# Patient Record
Sex: Male | Born: 1955 | Race: White | Hispanic: No | Marital: Single | Smoking: Never smoker
Health system: Southern US, Community
[De-identification: ages and names within clinical notes are randomized; demographics above are authoritative.]

## PROBLEM LIST (undated history)

## (undated) DIAGNOSIS — M545 Low back pain, unspecified: Secondary | ICD-10-CM

## (undated) DIAGNOSIS — N529 Male erectile dysfunction, unspecified: Secondary | ICD-10-CM

## (undated) DIAGNOSIS — R7989 Other specified abnormal findings of blood chemistry: Secondary | ICD-10-CM

## (undated) DIAGNOSIS — Z86010 Personal history of colonic polyps: Secondary | ICD-10-CM

## (undated) DIAGNOSIS — R972 Elevated prostate specific antigen [PSA]: Secondary | ICD-10-CM

## (undated) DIAGNOSIS — M62838 Other muscle spasm: Secondary | ICD-10-CM

## (undated) DIAGNOSIS — N419 Inflammatory disease of prostate, unspecified: Secondary | ICD-10-CM

## (undated) DIAGNOSIS — E291 Testicular hypofunction: Secondary | ICD-10-CM

## (undated) DIAGNOSIS — F419 Anxiety disorder, unspecified: Secondary | ICD-10-CM

## (undated) DIAGNOSIS — M542 Cervicalgia: Secondary | ICD-10-CM

## (undated) HISTORY — DX: Elevated prostate specific antigen (PSA): R97.20

## (undated) HISTORY — DX: Anxiety disorder, unspecified: F41.9

## (undated) HISTORY — DX: Low back pain: M54.5

## (undated) HISTORY — DX: Low back pain, unspecified: M54.50

## (undated) HISTORY — DX: Personal history of colonic polyps: Z86.010

## (undated) HISTORY — PX: TYMPANOSTOMY TUBE PLACEMENT: SHX32

## (undated) HISTORY — DX: Testicular hypofunction: E29.1

## (undated) HISTORY — DX: Cervicalgia: M54.2

## (undated) HISTORY — DX: Male erectile dysfunction, unspecified: N52.9

## (undated) HISTORY — DX: Inflammatory disease of prostate, unspecified: N41.9

## (undated) HISTORY — DX: Other specified abnormal findings of blood chemistry: R79.89

## (undated) HISTORY — DX: Other muscle spasm: M62.838

---

## 2003-09-16 HISTORY — PX: ROTATOR CUFF REPAIR: SHX139

## 2005-09-15 HISTORY — PX: UPPER GI ENDOSCOPY: SHX6162

## 2005-10-03 ENCOUNTER — Ambulatory Visit: Payer: Self-pay | Admitting: General Surgery

## 2010-09-15 HISTORY — PX: COLONOSCOPY: SHX174

## 2010-11-12 ENCOUNTER — Ambulatory Visit: Payer: Self-pay | Admitting: General Surgery

## 2010-11-14 LAB — PATHOLOGY REPORT

## 2010-11-15 ENCOUNTER — Ambulatory Visit: Payer: Self-pay | Admitting: General Surgery

## 2011-06-11 ENCOUNTER — Ambulatory Visit: Payer: Self-pay

## 2011-09-01 ENCOUNTER — Ambulatory Visit: Payer: Self-pay | Admitting: Unknown Physician Specialty

## 2013-03-31 ENCOUNTER — Encounter: Payer: Self-pay | Admitting: *Deleted

## 2014-10-26 ENCOUNTER — Ambulatory Visit: Payer: Self-pay | Admitting: Unknown Physician Specialty

## 2015-05-02 ENCOUNTER — Other Ambulatory Visit: Payer: 59

## 2015-05-02 ENCOUNTER — Other Ambulatory Visit: Payer: Self-pay | Admitting: *Deleted

## 2015-05-02 ENCOUNTER — Encounter: Payer: Self-pay | Admitting: *Deleted

## 2015-05-02 DIAGNOSIS — E349 Endocrine disorder, unspecified: Secondary | ICD-10-CM

## 2015-05-02 DIAGNOSIS — K219 Gastro-esophageal reflux disease without esophagitis: Secondary | ICD-10-CM | POA: Insufficient documentation

## 2015-05-02 DIAGNOSIS — R972 Elevated prostate specific antigen [PSA]: Secondary | ICD-10-CM

## 2015-05-02 DIAGNOSIS — F419 Anxiety disorder, unspecified: Secondary | ICD-10-CM | POA: Insufficient documentation

## 2015-05-03 LAB — PSA, TOTAL AND FREE
PSA, Free Pct: 15.8 %
PSA, Free: 0.57 ng/mL
Prostate Specific Ag, Serum: 3.6 ng/mL (ref 0.0–4.0)

## 2015-05-04 ENCOUNTER — Telehealth: Payer: Self-pay

## 2015-05-04 LAB — TESTOSTERONE,FREE AND TOTAL: TESTOSTERONE FREE: 17 pg/mL (ref 7.2–24.0)

## 2015-05-04 NOTE — Telephone Encounter (Signed)
-----   Message from Harle Battiest, PA-C sent at 05/04/2015  8:38 AM EDT ----- Would you call [facility] and ask them for the total testosterone?

## 2015-05-04 NOTE — Telephone Encounter (Signed)
Spoke with [facility] and results will be faxed over.

## 2015-05-10 ENCOUNTER — Encounter: Payer: Self-pay | Admitting: Urology

## 2015-05-10 ENCOUNTER — Ambulatory Visit (INDEPENDENT_AMBULATORY_CARE_PROVIDER_SITE_OTHER): Payer: 59 | Admitting: Urology

## 2015-05-10 VITALS — BP 125/77 | HR 83 | Resp 18 | Ht 64.0 in | Wt 147.3 lb

## 2015-05-10 DIAGNOSIS — E291 Testicular hypofunction: Secondary | ICD-10-CM

## 2015-05-10 DIAGNOSIS — N138 Other obstructive and reflux uropathy: Secondary | ICD-10-CM | POA: Insufficient documentation

## 2015-05-10 DIAGNOSIS — N401 Enlarged prostate with lower urinary tract symptoms: Secondary | ICD-10-CM | POA: Diagnosis not present

## 2015-05-10 DIAGNOSIS — N400 Enlarged prostate without lower urinary tract symptoms: Secondary | ICD-10-CM

## 2015-05-10 LAB — BLADDER SCAN AMB NON-IMAGING

## 2015-05-10 MED ORDER — CLOMIPHENE CITRATE 50 MG PO TABS
50.0000 mg | ORAL_TABLET | Freq: Every day | ORAL | Status: AC
Start: 2015-05-10 — End: ?

## 2015-05-10 NOTE — Progress Notes (Signed)
05/10/2015 2:24 PM   Oscar Lozano 03/23/1956 [id number]  Referring provider: No referring provider defined for this encounter.  Chief Complaint  Patient presents with  . Follow-up    1 YEAR  . Benign Prostatic Hypertrophy  . Erectile Dysfunction    HPI: Oscar Lozano is a 59 year old white male with hypogonadism and BPH with LUTS who presents today for follow up.  Hypogonadism Patient's pre-treatment serum testosterone was 317 ng/dL.  He has been taking Clomid 50 mg every other day with good effect.  He is filled out and ADAM questionnaire and answered yes to most of the questions, but he attributes this to an increase of stress at his workplace. He did not have these symptoms one month ago when he was on vacation at the beach.  BPH WITH LUTS His IPSS score today is 12, which is moderate lower urinary tract symptomatology. He is mostly satisfied with his quality life due to his urinary symptoms. His PVR is 18 mL.  His major complaint today a weak urinary stream.  He has had these symptoms for many years.  He denies any dysuria, hematuria or suprapubic pain.   He also denies any recent fevers, chills, nausea or vomiting.  He does not have a family history of PCa.      IPSS      05/10/15 1400       International Prostate Symptom Score   How often have you had the sensation of not emptying your bladder? Less than 1 in 5     How often have you had to urinate less than every two hours? Less than 1 in 5 times     How often have you found you stopped and started again several times when you urinated? About half the time     How often have you found it difficult to postpone urination? Not at All     How often have you had a weak urinary stream? Almost always     How often have you had to strain to start urination? Less than 1 in 5 times     How many times did you typically get up at night to urinate? 1 Time     Total IPSS Score 12     Quality of Life due to urinary symptoms     If you were to spend the rest of your life with your urinary condition just the way it is now how would you feel about that? Mostly Satisfied        Score:  1-7 Mild 8-19 Moderate 20-35 Severe   PMH: Past Medical History  Diagnosis Date  . Spasm of muscle   . Personal history of colonic polyps   . Cervicalgia   . Lumbago   . Elevated PSA   . Prostatitis   . Impotence   . Hypogonadism in male   . Low testosterone     Surgical History: Past Surgical History  Procedure Laterality Date  . Rotator cuff repair Left 2005  . Tympanostomy tube placement    . Colonoscopy  2012  . Upper gi endoscopy  2007    Home Medications:    Medication List       This list is accurate as of: 05/10/15  2:24 PM.  Always use your most recent med list.               clomiPHENE 50 MG tablet  Commonly known as:  CLOMID  Take by mouth.  FLUVIRIN Susp  Generic drug:  Influenza Vac Typ A&B Surf Ant     HYDROcodone-acetaminophen 5-325 MG per tablet  Commonly known as:  NORCO/VICODIN  Take by mouth.     omeprazole 10 MG capsule  Commonly known as:  PRILOSEC  Take by mouth.     Prasterone (DHEA) 50 MG Tabs  Take by mouth.        Allergies:  Allergies  Allergen Reactions  . Decongestant-Antihistamine [Triprolidine-Pse]     Cough/Cold/Allergy   jitters    Family History: Family History  Problem Relation Age of Onset  . Lung disease Father   . Coronary artery disease Father   . Diabetes Paternal Uncle     Social History:  reports that he has quit smoking. He has never used smokeless tobacco. He reports that he drinks alcohol. He reports that he does not use illicit drugs.  ROS: UROLOGY Frequent Urination?: No Hard to postpone urination?: No Burning/pain with urination?: No Get up at night to urinate?: No Leakage of urine?: No Urine stream starts and stops?: No Trouble starting stream?: No Do you have to strain to urinate?: No Blood in urine?: No Urinary  tract infection?: No Sexually transmitted disease?: No Injury to kidneys or bladder?: No Painful intercourse?: No Weak stream?: Yes Erection problems?: No Penile pain?: No  Gastrointestinal Nausea?: No Vomiting?: No Indigestion/heartburn?: Yes Diarrhea?: No Constipation?: No  Constitutional Fever: No Night sweats?: No Weight loss?: No Fatigue?: Yes  Skin Skin rash/lesions?: No Itching?: No  Eyes Blurred vision?: No Double vision?: No  Ears/Nose/Throat Sore throat?: No Sinus problems?: No  Hematologic/Lymphatic Swollen glands?: No Easy bruising?: No  Cardiovascular Leg swelling?: No Chest pain?: No  Respiratory Cough?: No Shortness of breath?: No  Endocrine Excessive thirst?: No  Musculoskeletal Back pain?: Yes Joint pain?: No  Neurological Headaches?: No Dizziness?: No  Psychologic Depression?: No Anxiety?: Yes  Physical Exam: BP 125/77 mmHg  Pulse 83  Resp 18  Ht 5\' 4"  (1.626 m)  Wt 147 lb 4.8 oz (66.815 kg)  BMI 25.27 kg/m2  GU: Patient with circumcised phallus.   Urethral meatus is patent.  No penile discharge. No penile lesions or rashes. Scrotum without lesions, cysts, rashes and/or edema.  Testicles are located scrotally bilaterally. No masses are appreciated in the testicles. Left and right epididymis are normal. Rectal: Patient with  normal sphincter tone. Perineum without scarring or rashes. No rectal masses are appreciated. Prostate is approximately 45 grams, no nodules are appreciated. Seminal vesicles are normal.   Laboratory Data:  PSA history:    2.4 ng/mL on 12/07/2013    3.5 ng/mL on 05/11/2014    2.7 ng/mL on 06/15/2014   Pertinent Imaging: Results for orders placed or performed in visit on 05/10/15  BLADDER SCAN AMB NON-IMAGING  Result Value Ref Range   Scan Result 18ml     Assessment & Plan:    1. BPH (benign prostatic hyperplasia) with LUTS:  Patient's IPSS score is 12/2.  His PVR 18 mL.  His DRE demonstrates a  normal prostate.  We will continue observation over time with prn avoidance of alcohol/caffeine.   He will follow up in 6 months for a PSA, DRE, PVR and an IPSS.  - BLADDER SCAN AMB NON-IMAGING  2. Hypogonadism:   Patient  is having good effect with Clomid 50 mg every other day. He will continue this regimen. I sent a refill of this medication to his pharmacy. He'll return in 6 months for a morning testosterone  draw and HCT.    No Follow-up on file.  Michiel Cowboy, PA-C  [facility] [address], [address] [place], [place] [address] [phone]

## 2015-05-23 ENCOUNTER — Other Ambulatory Visit: Payer: Self-pay | Admitting: Urology

## 2015-10-22 ENCOUNTER — Other Ambulatory Visit: Payer: 59

## 2015-10-22 ENCOUNTER — Encounter: Payer: Self-pay | Admitting: Urology

## 2015-10-31 ENCOUNTER — Telehealth: Payer: Self-pay | Admitting: Urology

## 2015-10-31 DIAGNOSIS — Z79899 Other long term (current) drug therapy: Secondary | ICD-10-CM

## 2015-10-31 NOTE — Telephone Encounter (Signed)
Orders placed as PSA and testosterone, not free and total for either.

## 2015-10-31 NOTE — Telephone Encounter (Signed)
Pt called and RS his lab appt to this Friday 2/17.  He has requested that both the testosterone level and the PSA level be drawn as total and free.  Please put the orders in for this and give the patient a call if any questions.

## 2015-10-31 NOTE — Telephone Encounter (Signed)
That is fine with me, but the free and total PSA is only accurate with PSA's between 4 and 10.  Otherwise, it is not a very useful test.  I don't need the free and total testosterone to treat the hypogonadism.  It actually might cause problems with his insurance company paying for his medication if the free testosterone comes back within normal parameters.  And, his insurance may not cover either blood work without an abnormal PSA or testosterone level.

## 2015-11-01 ENCOUNTER — Other Ambulatory Visit: Payer: Self-pay

## 2015-11-01 DIAGNOSIS — E291 Testicular hypofunction: Secondary | ICD-10-CM

## 2015-11-02 ENCOUNTER — Other Ambulatory Visit: Payer: 59

## 2015-11-02 DIAGNOSIS — E291 Testicular hypofunction: Secondary | ICD-10-CM

## 2015-11-02 DIAGNOSIS — Z79899 Other long term (current) drug therapy: Secondary | ICD-10-CM

## 2015-11-03 LAB — PSA: PROSTATE SPECIFIC AG, SERUM: 3.6 ng/mL (ref 0.0–4.0)

## 2015-11-03 LAB — HEMATOCRIT: Hematocrit: 49.8 % (ref 37.5–51.0)

## 2015-11-03 LAB — TESTOSTERONE: Testosterone: 448 ng/dL (ref 348–1197)

## 2015-11-12 ENCOUNTER — Ambulatory Visit (INDEPENDENT_AMBULATORY_CARE_PROVIDER_SITE_OTHER): Payer: 59 | Admitting: Urology

## 2015-11-12 ENCOUNTER — Encounter: Payer: Self-pay | Admitting: Urology

## 2015-11-12 ENCOUNTER — Ambulatory Visit: Payer: 59 | Admitting: Urology

## 2015-11-12 VITALS — BP 128/68 | HR 81 | Ht 64.0 in | Wt 147.7 lb

## 2015-11-12 DIAGNOSIS — F528 Other sexual dysfunction not due to a substance or known physiological condition: Secondary | ICD-10-CM | POA: Diagnosis not present

## 2015-11-12 DIAGNOSIS — N401 Enlarged prostate with lower urinary tract symptoms: Secondary | ICD-10-CM

## 2015-11-12 DIAGNOSIS — E291 Testicular hypofunction: Secondary | ICD-10-CM | POA: Diagnosis not present

## 2015-11-12 DIAGNOSIS — N138 Other obstructive and reflux uropathy: Secondary | ICD-10-CM

## 2015-11-12 DIAGNOSIS — F5221 Male erectile disorder: Secondary | ICD-10-CM | POA: Insufficient documentation

## 2015-11-12 NOTE — Progress Notes (Signed)
1:45 PM   Oscar Lozano June 21, 1956 [id number]  Referring provider: Marguarite Arbour, MD [address] [facility] [place], [place] [address]  Chief Complaint  Patient presents with  . Benign Prostatic Hypertrophy    6 month follow up  . Hypogonadism    HPI: [name] is a 60 year old white male with hypogonadism, erectile dysfunction and BPH with LUTS who presents today for follow up.  Hypogonadism Patient's pre-treatment serum testosterone was 317 ng/dL.  He has been taking Clomid 50 mg 1/2 tablet every other day with adequate effect. He has noticed a decrease in height.  He also had noticed to have a decrease in his energy.  This is indicated by his ADAM questionnaire.       Androgen Deficiency in the Aging Male      11/12/15 1300       Androgen Deficiency in the Aging Male   Do you have a decrease in libido (sex drive) No     Do you have lack of energy Yes     Do you have a decrease in strength and/or endurance No     Have you lost height Yes     Have you noticed a decreased "enjoyment of life" No     Are you sad and/or grumpy No     Are your erections less strong No     Have you noticed a recent deterioration in your ability to play sports No     Are you falling asleep after dinner No     Has there been a recent deterioration in your work performance No        Erectile dysfunction His SHIM score is 20, which is mild ED.   He has been having difficulty with erections for the last five years.   His libido is preserved.   His risk factors for ED are BPH, age and hypogonadism .  He denies any painful erections or curvatures with his erections.        SHIM      11/12/15 1326       SHIM: Over the last 6 months:   How do you rate your confidence that you could get and keep an erection? Moderate     When you had erections with sexual stimulation, how often were your erections hard enough for penetration (entering your partner)? Most Times (much  more than half the time)     During sexual intercourse, how often were you able to maintain your erection after you had penetrated (entered) your partner? Slightly Difficult     During sexual intercourse, how difficult was it to maintain your erection to completion of intercourse? Slightly Difficult     When you attempted sexual intercourse, how often was it satisfactory for you? Not Difficult     SHIM Total Score   SHIM 20        Score: 1-7 Severe ED 8-11 Moderate ED 12-16 Mild-Moderate ED 17-21 Mild ED 22-25 No ED   BPH WITH LUTS His IPSS score today is 9, which is moderate lower urinary tract symptomatology. He is mixed with his quality life due to his urinary symptoms.  His major complaint today a weak urinary stream.  He has had these symptoms for many years.  He denies any dysuria, hematuria or suprapubic pain.   He also denies any recent fevers, chills, nausea or vomiting.  He does not have a family history of PCa.  IPSS      11/12/15 1300       International Prostate Symptom Score   How often have you had the sensation of not emptying your bladder? Less than 1 in 5     How often have you had to urinate less than every two hours? Less than 1 in 5 times     How often have you found you stopped and started again several times when you urinated? Less than 1 in 5 times     How often have you found it difficult to postpone urination? Not at All     How often have you had a weak urinary stream? Almost always     How often have you had to strain to start urination? Not at All     How many times did you typically get up at night to urinate? 1 Time     Total IPSS Score 9     Quality of Life due to urinary symptoms   If you were to spend the rest of your life with your urinary condition just the way it is now how would you feel about that? Mixed        Score:  1-7 Mild 8-19 Moderate 20-35 Severe   PMH: Past Medical History  Diagnosis Date  . Spasm of muscle   .  Personal history of colonic polyps   . Cervicalgia   . Lumbago   . Elevated PSA   . Prostatitis   . Impotence   . Hypogonadism in male   . Low testosterone   . Anxiety     Surgical History: Past Surgical History  Procedure Laterality Date  . Rotator cuff repair Left 2005  . Tympanostomy tube placement    . Colonoscopy  2012  . Upper gi endoscopy  2007    Home Medications:    Medication List       This list is accurate as of: 11/12/15  1:45 PM.  Always use your most recent med list.               clomiPHENE 50 MG tablet  Commonly known as:  CLOMID  Take 1 tablet (50 mg total) by mouth daily.     FLUVIRIN Susp  Generic drug:  Influenza Vac Typ A&B Surf Ant  Reported on 11/12/2015     HYDROcodone-acetaminophen 5-325 MG tablet  Commonly known as:  NORCO/VICODIN  Take by mouth. Reported on 11/12/2015     omeprazole 10 MG capsule  Commonly known as:  PRILOSEC  Take by mouth.     Prasterone (DHEA) 50 MG Tabs  Take by mouth.        Allergies:  Allergies  Allergen Reactions  . Decongestant-Antihistamine [Triprolidine-Pse]     Cough/Cold/Allergy   jitters  . Diphenhydramine-Acetaminophen Other (See Comments)    Family History: Family History  Problem Relation Age of Onset  . Lung disease Father   . Coronary artery disease Father   . Diabetes Paternal Uncle   . Kidney disease Neg Hx   . Prostate cancer Neg Hx     Social History:  reports that he has never smoked. He has never used smokeless tobacco. He reports that he drinks alcohol. He reports that he does not use illicit drugs.  ROS: UROLOGY Frequent Urination?: No Hard to postpone urination?: No Burning/pain with urination?: No Get up at night to urinate?: Yes Leakage of urine?: No Urine stream starts and stops?: No Trouble starting stream?: No Do you  have to strain to urinate?: No Blood in urine?: No Urinary tract infection?: No Sexually transmitted disease?: No Injury to kidneys or  bladder?: No Painful intercourse?: No Weak stream?: Yes Erection problems?: No Penile pain?: No  Gastrointestinal Nausea?: No Vomiting?: No Indigestion/heartburn?: Yes Diarrhea?: No Constipation?: No  Constitutional Fever: No Night sweats?: No Weight loss?: No Fatigue?: Yes  Skin Skin rash/lesions?: No Itching?: No  Eyes Blurred vision?: Yes Double vision?: No  Ears/Nose/Throat Sore throat?: No Sinus problems?: Yes  Hematologic/Lymphatic Swollen glands?: No Easy bruising?: No  Cardiovascular Leg swelling?: No Chest pain?: No  Respiratory Cough?: No Shortness of breath?: No  Endocrine Excessive thirst?: No  Musculoskeletal Back pain?: Yes Joint pain?: Yes  Neurological Headaches?: No Dizziness?: No  Psychologic Depression?: No Anxiety?: Yes  Physical Exam: BP 128/68 mmHg  Pulse 81  Ht  (1.626 m)  Wt 147 lb 11.2 oz (66.996 kg)  BMI 25.34 kg/m2  GU: Patient with circumcised phallus.   Urethral meatus is patent.  No penile discharge. No penile lesions or rashes. Scrotum without lesions, cysts, rashes and/or edema.  Testicles are located scrotally bilaterally. No masses are appreciated in the testicles. Left and right epididymis are normal. Rectal: Patient with  normal sphincter tone. Perineum without scarring or rashes. No rectal masses are appreciated. Prostate is approximately 45 grams, no nodules are appreciated. Seminal vesicles are normal.   Laboratory Data:  PSA history:    2.4 ng/mL on 12/07/2013    3.5 ng/mL on 05/11/2014    2.7 ng/mL on 06/15/2014   3.6 ng/mL on  05/02/2015   3.6 ng/mL on 11/02/2015  Results for orders placed or performed in visit on 11/02/15  Testosterone  Result Value Ref Range   Testosterone 448 348 - 1197 ng/dL   Comment, Testosterone Comment   PSA  Result Value Ref Range   Prostate Specific Ag, Serum 3.6 0.0 - 4.0 ng/mL  Hematocrit  Result Value Ref Range   Hematocrit 49.8 37.5 - 51.0 %     Assessment & Plan:    1. BPH (benign prostatic hyperplasia) with LUTS:  Patient's IPSS score is 9/3.   His DRE demonstrates a normal prostate.  We will continue observation over time with prn avoidance of alcohol/caffeine.   He will follow up in 6 months for a PSA, DRE, PVR and an IPSS.  2. Hypogonadism:   Patient  is not having as good effect with Clomid 50 mg, 1/2 tablet every other day.  He will increase the dose to 1/2 tablet everyday.  He will RTC in one month for a morning testosterone before 9AM.    3. Erectile dysfunction:   SHIM score is 20.  We will continue to monitor.  We will repeat the SHIM score and exam in 6 months.    Return in about 1 month (around 12/10/2015) for testosterone level before 9 AM.  Michiel Cowboy, [facility] [address], [address] [place], [place] [address] [phone]

## 2015-11-13 ENCOUNTER — Telehealth: Payer: Self-pay

## 2015-11-13 LAB — HEPATIC FUNCTION PANEL
ALT: 32 IU/L (ref 0–44)
AST: 29 IU/L (ref 0–40)
Albumin: 4 g/dL (ref 3.6–4.8)
Alkaline Phosphatase: 46 IU/L (ref 39–117)
BILIRUBIN, DIRECT: 0.16 mg/dL (ref 0.00–0.40)
Bilirubin Total: 0.5 mg/dL (ref 0.0–1.2)
TOTAL PROTEIN: 6.9 g/dL (ref 6.0–8.5)

## 2015-11-13 LAB — LIPID PANEL
CHOLESTEROL TOTAL: 213 mg/dL — AB (ref 100–199)
Chol/HDL Ratio: 2.8 ratio units (ref 0.0–5.0)
HDL: 76 mg/dL (ref >39)
LDL CALC: 108 mg/dL — AB (ref 0–99)
TRIGLYCERIDES: 146 mg/dL (ref 0–149)
VLDL CHOLESTEROL CAL: 29 mg/dL (ref 5–40)

## 2015-11-13 LAB — TSH: TSH: 1.17 u[IU]/mL (ref 0.450–4.500)

## 2015-11-13 LAB — ESTRADIOL: ESTRADIOL: 12.6 pg/mL (ref 7.6–42.6)

## 2015-11-13 NOTE — Telephone Encounter (Signed)
-----   Message from Harle Battiest, PA-C sent at 11/13/2015  1:00 PM EST ----- His blood work is normal.  We will see him in six months.

## 2015-11-13 NOTE — Telephone Encounter (Signed)
Spoke with pt in reference to lab results. Pt voiced understanding.  

## 2015-12-03 ENCOUNTER — Other Ambulatory Visit: Payer: 59

## 2015-12-10 ENCOUNTER — Other Ambulatory Visit: Payer: 59

## 2016-03-25 ENCOUNTER — Other Ambulatory Visit: Payer: Self-pay | Admitting: Neurosurgery

## 2016-03-25 DIAGNOSIS — G5621 Lesion of ulnar nerve, right upper limb: Secondary | ICD-10-CM

## 2016-06-26 IMAGING — MR MRI CERVICAL SPINE WITHOUT CONTRAST
5 series · 32 of 48 positions shown · non-contrast
Comparison: None.

CLINICAL DATA: Cervical spine pain. Numbness and weakness in the
arm. Status post fall 5 years ago.

EXAM:
MRI CERVICAL SPINE WITHOUT CONTRAST
TECHNIQUE: Multiplanar, multisequence MR imaging of the cervical spine was
performed. No intravenous contrast was administered.

[Series 2: T2 · sagittal · 3.0mm · 0.56mm/px · 7 of 15 slices shown (1 of 2)]
[im 1/15]
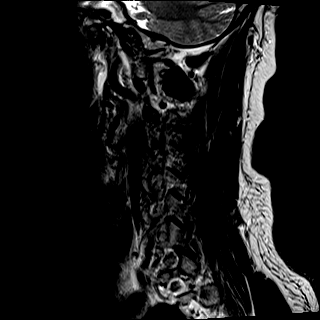
[im 3/15]
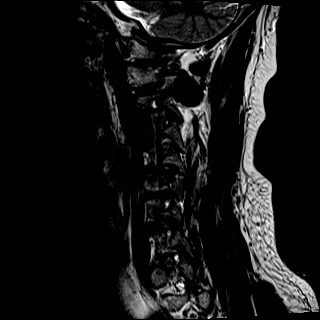
[im 5/15]
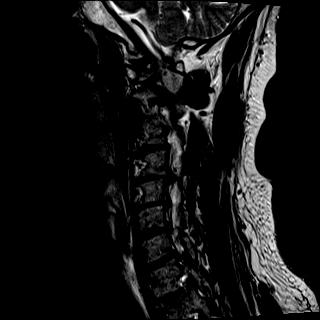
[im 8/15]
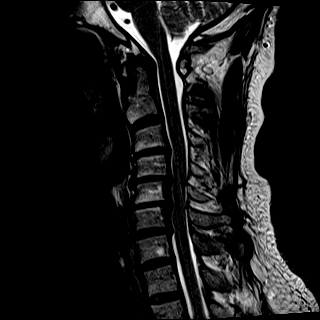
[im 10/15]
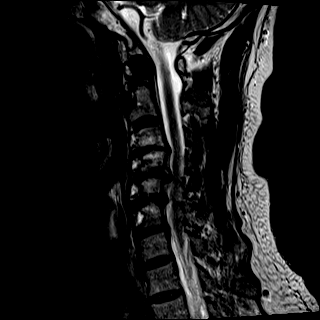
[im 12/15]
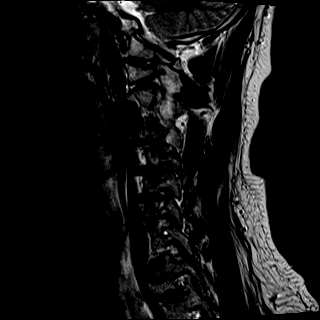
[im 15/15]
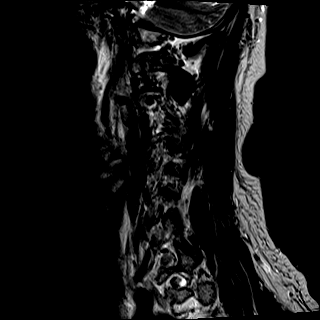

[Series 3: T1 · sagittal · 3.0mm · 0.56mm/px · 7 of 15 slices shown]
[im 1/15]
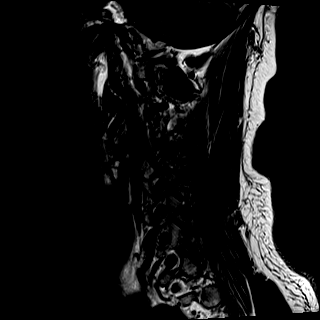
[im 3/15]
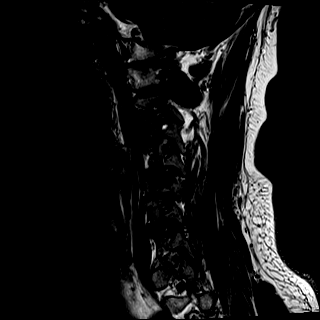
[im 5/15]
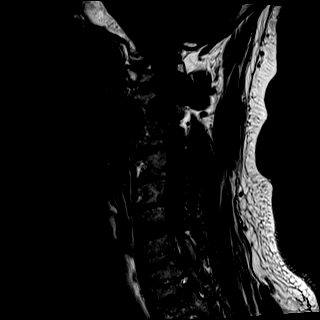
[im 8/15]
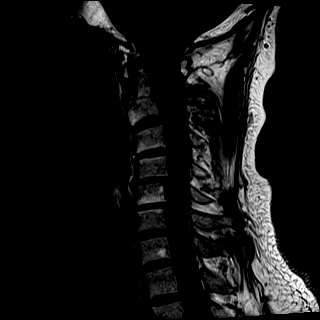
[im 10/15]
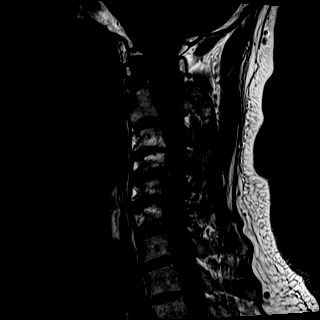
[im 12/15]
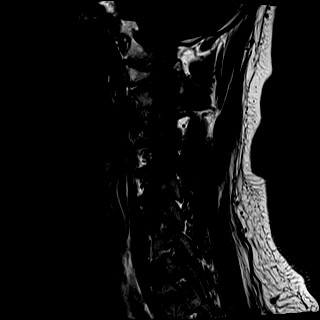
[im 15/15]
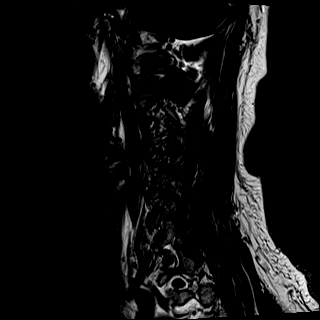

[Series 4: STIR · sagittal · 3.0mm · 0.35mm/px · 8 of 15 slices shown]
[im 1/15]
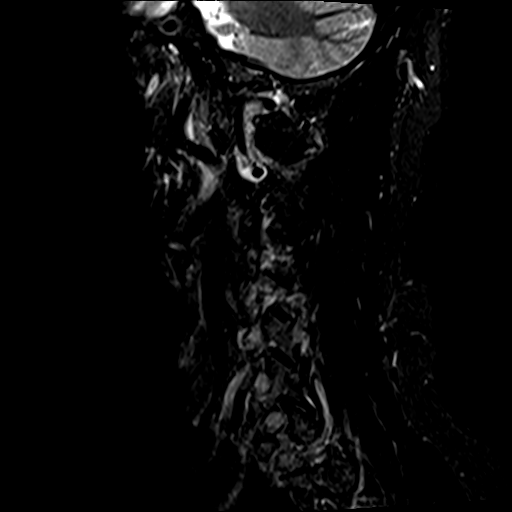
[im 3/15]
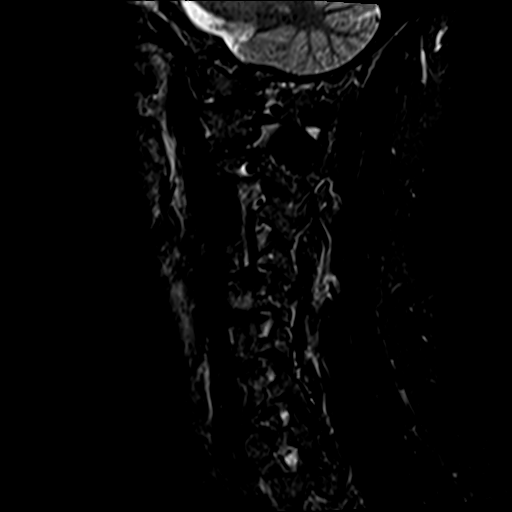
[im 5/15]
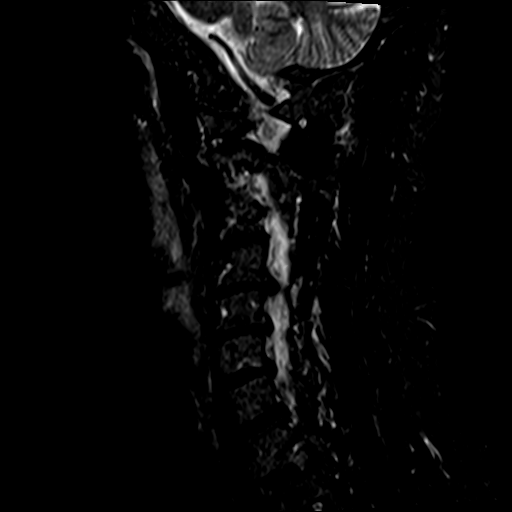
[im 7/15]
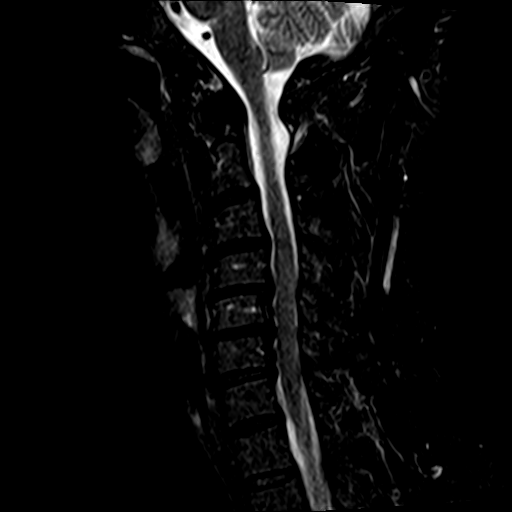
[im 9/15]
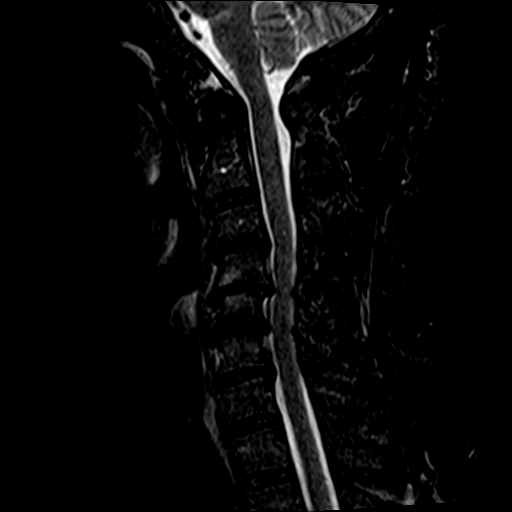
[im 11/15]
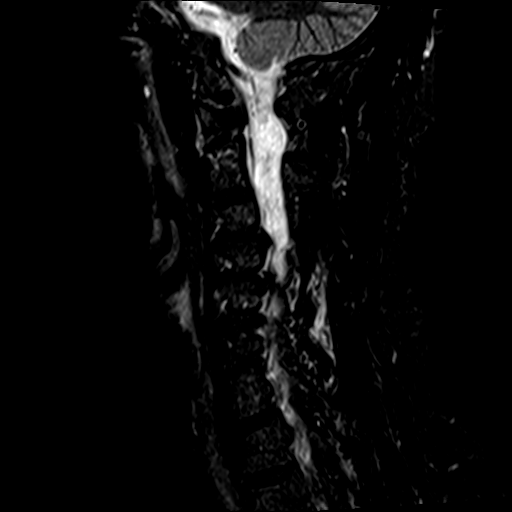
[im 13/15]
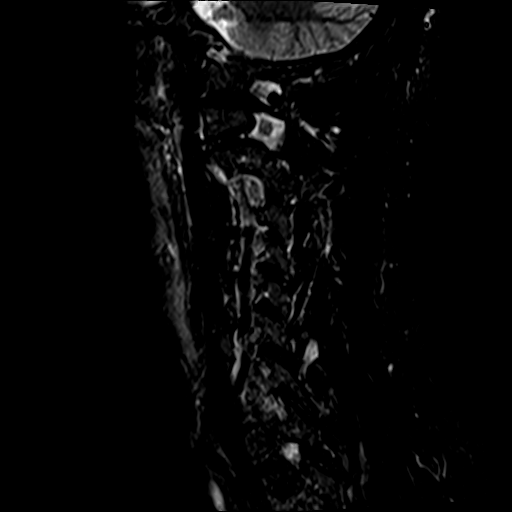
[im 15/15]
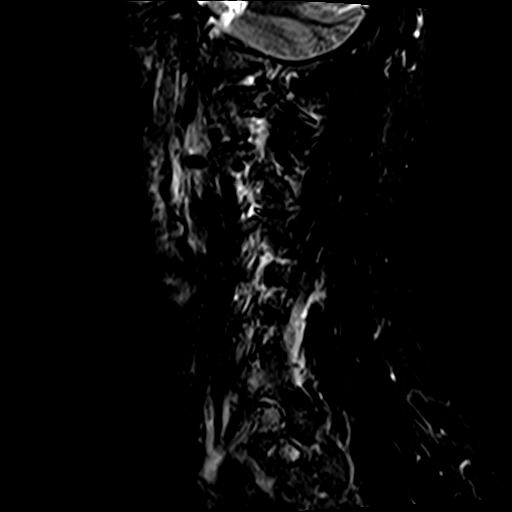

[Series 5: T2 · axial · 3.0mm · 0.62mm/px · z∈[-77,+15]mm · 9 of 25 slices shown (2 of 2)]
[im 1/25]
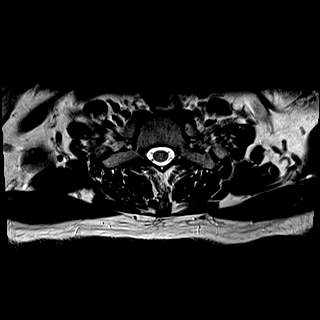
[im 5/25]
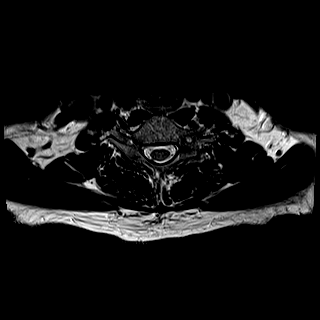
[im 9/25]
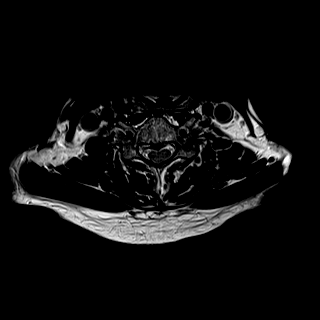
[im 11/25]
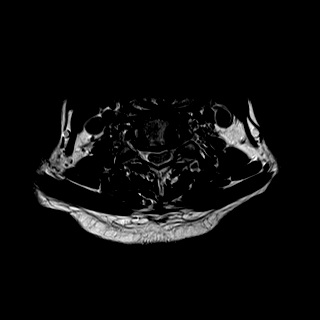
[im 13/25]
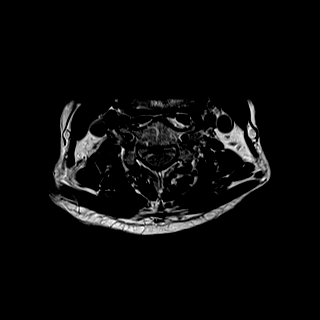
[im 15/25]
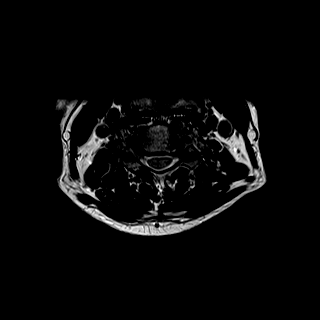
[im 17/25]
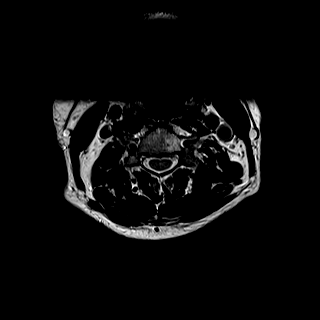
[im 21/25]
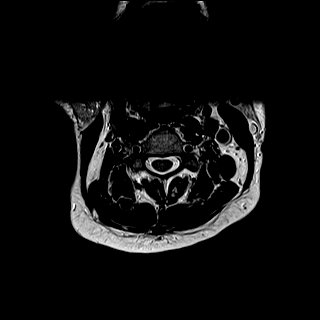
[im 25/25]
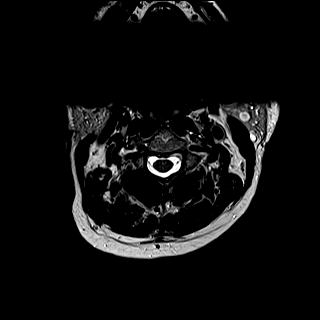

[Series 6: mpgr ax · axial · 3.0mm · 0.35mm/px · 1 of 25 slices shown]
[im 1/25]
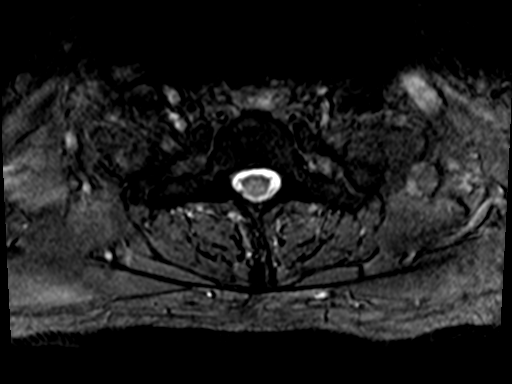

[32 of 48 positions shown; findings below may reference images not displayed]

FINDINGS: The cervical cord is normal in size and signal. Vertebral body
heights are maintained. There is degenerative disc disease of C3-4,
C4-5 and C5-6 with disc space narrowing. There is straightening of
the cervical spine. There is 2 mm of retrolisthesis of C4 on C5.
Bone marrow signal is normal. Cerebellar tonsils are normal in
position.

C2-3: No significant disc bulge. No neural foraminal stenosis. No
central canal stenosis.

C3-4: Mild broad-based disc bulge. Mild left foraminal stenosis. No
significant right foraminal stenosis. No central canal stenosis.

C4-5: Mild broad-based disc bulge. Bilateral uncovertebral
degenerative changes resulting in severe left and moderate right
foraminal stenosis. There is mild central canal stenosis.

C5-6: Mild broad-based disc bulge with a central disc protrusion
abutting the ventral cervical spinal cord. Bilateral uncovertebral
degenerative changes resulting in moderate bilateral foraminal
stenosis, left worse than right. No central canal stenosis.

C6-7: Mild broad-based disc bulge with a shallow right foraminal
disc protrusion resulting in mild right foraminal stenosis. No
significant left foraminal stenosis. No central canal stenosis.

C7-T1: No significant disc bulge. Mild right foraminal narrowing. No
left foraminal narrowing. No central canal stenosis.
IMPRESSION: 1. At C4-5 there is a mild broad-based disc bulge. Bilateral
uncovertebral degenerative changes resulting in severe left and
moderate right foraminal stenosis. There is mild central canal
stenosis.
2. At C5-6 there is a mild broad-based disc bulge with a central
disc protrusion abutting the ventral cervical spinal cord. Bilateral
uncovertebral degenerative changes resulting in moderate bilateral
foraminal stenosis, left worse than right.
3. At C6-7 there is a mild broad-based disc bulge with a shallow
right foraminal disc protrusion resulting in mild right foraminal
stenosis.

## 2018-11-11 ENCOUNTER — Ambulatory Visit: Payer: Self-pay | Admitting: General Surgery
